# Patient Record
Sex: Female | Born: 1965 | Race: White | Marital: Married | Smoking: Never smoker
Health system: Southern US, Community
[De-identification: ages and names within clinical notes are randomized; demographics above are authoritative.]

## PROBLEM LIST (undated history)

## (undated) DIAGNOSIS — I10 Essential (primary) hypertension: Secondary | ICD-10-CM

## (undated) HISTORY — PX: LASIK: SHX215

## (undated) HISTORY — DX: Essential (primary) hypertension: I10

---

## 1999-11-11 HISTORY — PX: AUGMENTATION MAMMAPLASTY: SUR837

## 2010-11-10 HISTORY — PX: AUGMENTATION MAMMAPLASTY: SUR837

## 2018-01-15 ENCOUNTER — Other Ambulatory Visit: Payer: Self-pay | Admitting: Occupational Medicine

## 2018-01-15 ENCOUNTER — Ambulatory Visit
Admission: RE | Admit: 2018-01-15 | Discharge: 2018-01-15 | Disposition: A | Discharge status: Home / Self Care | Payer: Self-pay | Source: Ambulatory Visit | Attending: Occupational Medicine | Admitting: Occupational Medicine

## 2018-01-15 DIAGNOSIS — Z021 Encounter for pre-employment examination: Secondary | ICD-10-CM

## 2019-01-08 ENCOUNTER — Ambulatory Visit: Payer: Self-pay | Admitting: Nurse Practitioner

## 2019-01-08 VITALS — BP 120/90 | HR 82 | Temp 98.7°F | Resp 12 | Wt 168.0 lb

## 2019-01-08 DIAGNOSIS — H6121 Impacted cerumen, right ear: Secondary | ICD-10-CM

## 2019-01-08 NOTE — Progress Notes (Signed)
MRN: [id number] DOB: 01-15-1966  Subjective:   Lauren Lara is a 53 y.o. female presenting for chief complaint of right air pain (x4 days (benadyrl allergy)) .  Reports 4 day history of right ear pain.Marland Kitchen Has tried apple cider vinegar and peroxide for relief.  Patient has also been taking Benadryl Allergy at bedtime. Denies fever, sinus headache, sinus congestion , itchy watery eyes, red eyes, ear drainage, sore throat, dry cough, productive cough, wheezing and shortness of breath, chills, fatigue, nausea, vomiting, abdominal pain and diarrhea. 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 Admits to a history of seasonal allergies, denies history of asthma. Denies smoking. Denies any other aggravating or relieving factors, no other questions or concerns.  Review of Systems  Constitutional: Negative.   HENT: Positive for ear pain (right). Negative for congestion, ear discharge, sinus pain and sore throat.   Eyes: Negative.   Respiratory: Negative.   Gastrointestinal: Negative.   Skin: Negative.   Neurological: Negative.     Lauren Lara has a current medication list which includes the following prescription(s): phentermine hcl. Also has No Known Allergies.  Lauren Lara  has no past medical history on file. Also  has no past surgical history on file.   Objective:   Vitals: BP 120/90   Pulse 82   Temp 98.7 F (37.1 C)   Resp 12   Wt 168 lb (76.2 kg)   SpO2 98%   Physical Exam Vitals signs reviewed.  Constitutional:      General: She is not in acute distress. HENT:     Head: Normocephalic.     Right Ear: Ear canal and external ear normal. There is impacted cerumen.     Left Ear: Ear canal and external ear normal.     Ears:     Comments: Moderate cerumen impaction to right ear. Could not visualize TM.  Will perform gentle irrigation.  S/P Ear Irrigation: Right TM opaque, landmarks are visible, no bulging or erythema noted.  Canal is without erythema.    Nose:  Mucosal edema present.     Right Turbinates: Enlarged and swollen.     Left Turbinates: Enlarged and swollen.     Right Sinus: No maxillary sinus tenderness or frontal sinus tenderness.     Left Sinus: No maxillary sinus tenderness or frontal sinus tenderness.     Mouth/Throat:     Lips: Pink.     Pharynx: Oropharynx is clear. Uvula midline. No pharyngeal swelling, oropharyngeal exudate, posterior oropharyngeal erythema or uvula swelling.     Tonsils: Swelling: 0 on the right. 0 on the left.  Eyes:     Pupils: Pupils are equal, round, and reactive to light.  Neck:     Musculoskeletal: Normal range of motion and neck supple.  Cardiovascular:     Rate and Rhythm: Normal rate and regular rhythm.     Pulses: Normal pulses.     Heart sounds: Normal heart sounds.  Pulmonary:     Effort: Pulmonary effort is normal.     Breath sounds: Normal breath sounds.  Abdominal:     General: Abdomen is flat. Bowel sounds are normal.     Tenderness: There is no abdominal tenderness.  Lymphadenopathy:     Cervical: No cervical adenopathy.  Skin:    General: Skin is warm and dry.     Capillary Refill: Capillary refill takes less than 2 seconds.  Neurological:     General: No focal deficit present.  Mental Status: She is alert and oriented to person, place, and time.     Cranial Nerves: No cranial nerve deficit.   Patient reports no ear pain after ear irrigation.  Patient tolerated procedure well.  Assessment and Plan :   Exam findings, diagnosis etiology and medication use and indications reviewed with patient. Follow- Up and discharge instructions provided. No emergent/urgent issues found on exam.  After gentle irrigation of the right ear, patient was found to have cerumen impaction without further infection. Patient's canal was without erythema or edema. Patient education was provided. Patient did not exhibit a middle ear infection, or purulent drainage in the middle ear. Discussed with patient  how to use peroxide to perform cerumen disimpaction at home, along with using OTC ear wax softner  Patient verbalized understanding of information provided and agrees with plan of care (POC), all questions answered. The patient is advised to call or return to clinic if condition does not see an improvement in symptoms, or to seek the care of the closest emergency department if condition worsens with the above plan   1. Impacted cerumen of right ear  -Purchase OTC Debrox to help soften the wax in the ears.  Use as directed. -May allow warm water to run into the ear canal occasionally to keep the ear wax soft. -Ibuprofen or Tylenol for discomfort. -Warm compresses as needed to the affected ear until symptoms improve. -Follow up as needed.

## 2019-01-08 NOTE — Patient Instructions (Addendum)
Earwax Buildup, Adult  -Purchase OTC Debrox to help soften the wax in the ears.  Use as directed. -May allow warm water to run into the ear canal occasionally to keep the ear wax soft. -Ibuprofen or Tylenol for discomfort. -Warm compresses as needed to the affected ear until symptoms improve. -Follow up as needed.  e ears produce a substance called earwax that helps keep bacteria out of the ear and protects the skin in the ear canal. Occasionally, earwax can build up in the ear and cause discomfort or hearing loss. What increases the risk? This condition is more likely to develop in people who:  Are female.  Are elderly.  Naturally produce more earwax.  Clean their ears often with cotton swabs.  Use earplugs often.  Use in-ear headphones often.  Wear hearing aids.  Have narrow ear canals.  Have earwax that is overly thick or sticky.  Have eczema.  Are dehydrated.  Have excess hair in the ear canal. What are the signs or symptoms? Symptoms of this condition include:  Reduced or muffled hearing.  A feeling of fullness in the ear or feeling that the ear is plugged.  Fluid coming from the ear.  Ear pain.  Ear itch.  Ringing in the ear.  Coughing.  An obvious piece of earwax that can be seen inside the ear canal. How is this diagnosed? This condition may be diagnosed based on:  Your symptoms.  Your medical history.  An ear exam. During the exam, your health care provider will look into your ear with an instrument called an otoscope. You may have tests, including a hearing test. How is this treated? This condition may be treated by:  Using ear drops to soften the earwax.  Having the earwax removed by a health care provider. The health care provider may: ? Flush the ear with water. ? Use an instrument that has a loop on the end (curette). ? Use a suction device.  Surgery to remove the wax buildup. This may be done in severe cases. Follow these  instructions at home:   Take over-the-counter and prescription medicines only as told by your health care provider.  Do not put any objects, including cotton swabs, into your ear. You can clean the opening of your ear canal with a washcloth or facial tissue.  Follow instructions from your health care provider about cleaning your ears. Do not over-clean your ears.  Drink enough fluid to keep your urine clear or pale yellow. This will help to thin the earwax.  Keep all follow-up visits as told by your health care provider. If earwax builds up in your ears often or if you use hearing aids, consider seeing your health care provider for routine, preventive ear cleanings. Ask your health care provider how often you should schedule your cleanings.  If you have hearing aids, clean them according to instructions from the manufacturer and your health care provider. Contact a health care provider if:  You have ear pain.  You develop a fever.  You have blood, pus, or other fluid coming from your ear.  You have hearing loss.  You have ringing in your ears that does not go away.  Your symptoms do not improve with treatment.  You feel like the room is spinning (vertigo). Summary  Earwax can build up in the ear and cause discomfort or hearing loss.  The most common symptoms of this condition include reduced or muffled hearing and a feeling of fullness in the ear  or feeling that the ear is plugged.  This condition may be diagnosed based on your symptoms, your medical history, and an ear exam.  This condition may be treated by using ear drops to soften the earwax or by having the earwax removed by a health care provider.  Do not put any objects, including cotton swabs, into your ear. You can clean the opening of your ear canal with a washcloth or facial tissue. This information is not intended to replace advice given to you by your health care provider. Make sure you discuss any questions you  have with your health care provider. Document Released: 12/04/2004 Document Revised: 10/08/2017 Document Reviewed: 01/07/2017 Elsevier Interactive Patient Education  2019 [employer].

## 2019-01-10 ENCOUNTER — Telehealth: Payer: Self-pay

## 2019-01-10 NOTE — Telephone Encounter (Signed)
Patient did not answered the phone, I left a message asking to call us back.  

## 2019-03-07 IMAGING — CR DG CHEST 1V
1 series · 1 of 1 positions shown · non-contrast
Comparison: None.

CLINICAL DATA: Pre-employment chest x-ray

EXAM:
CHEST 1 VIEW

[w chest pa]
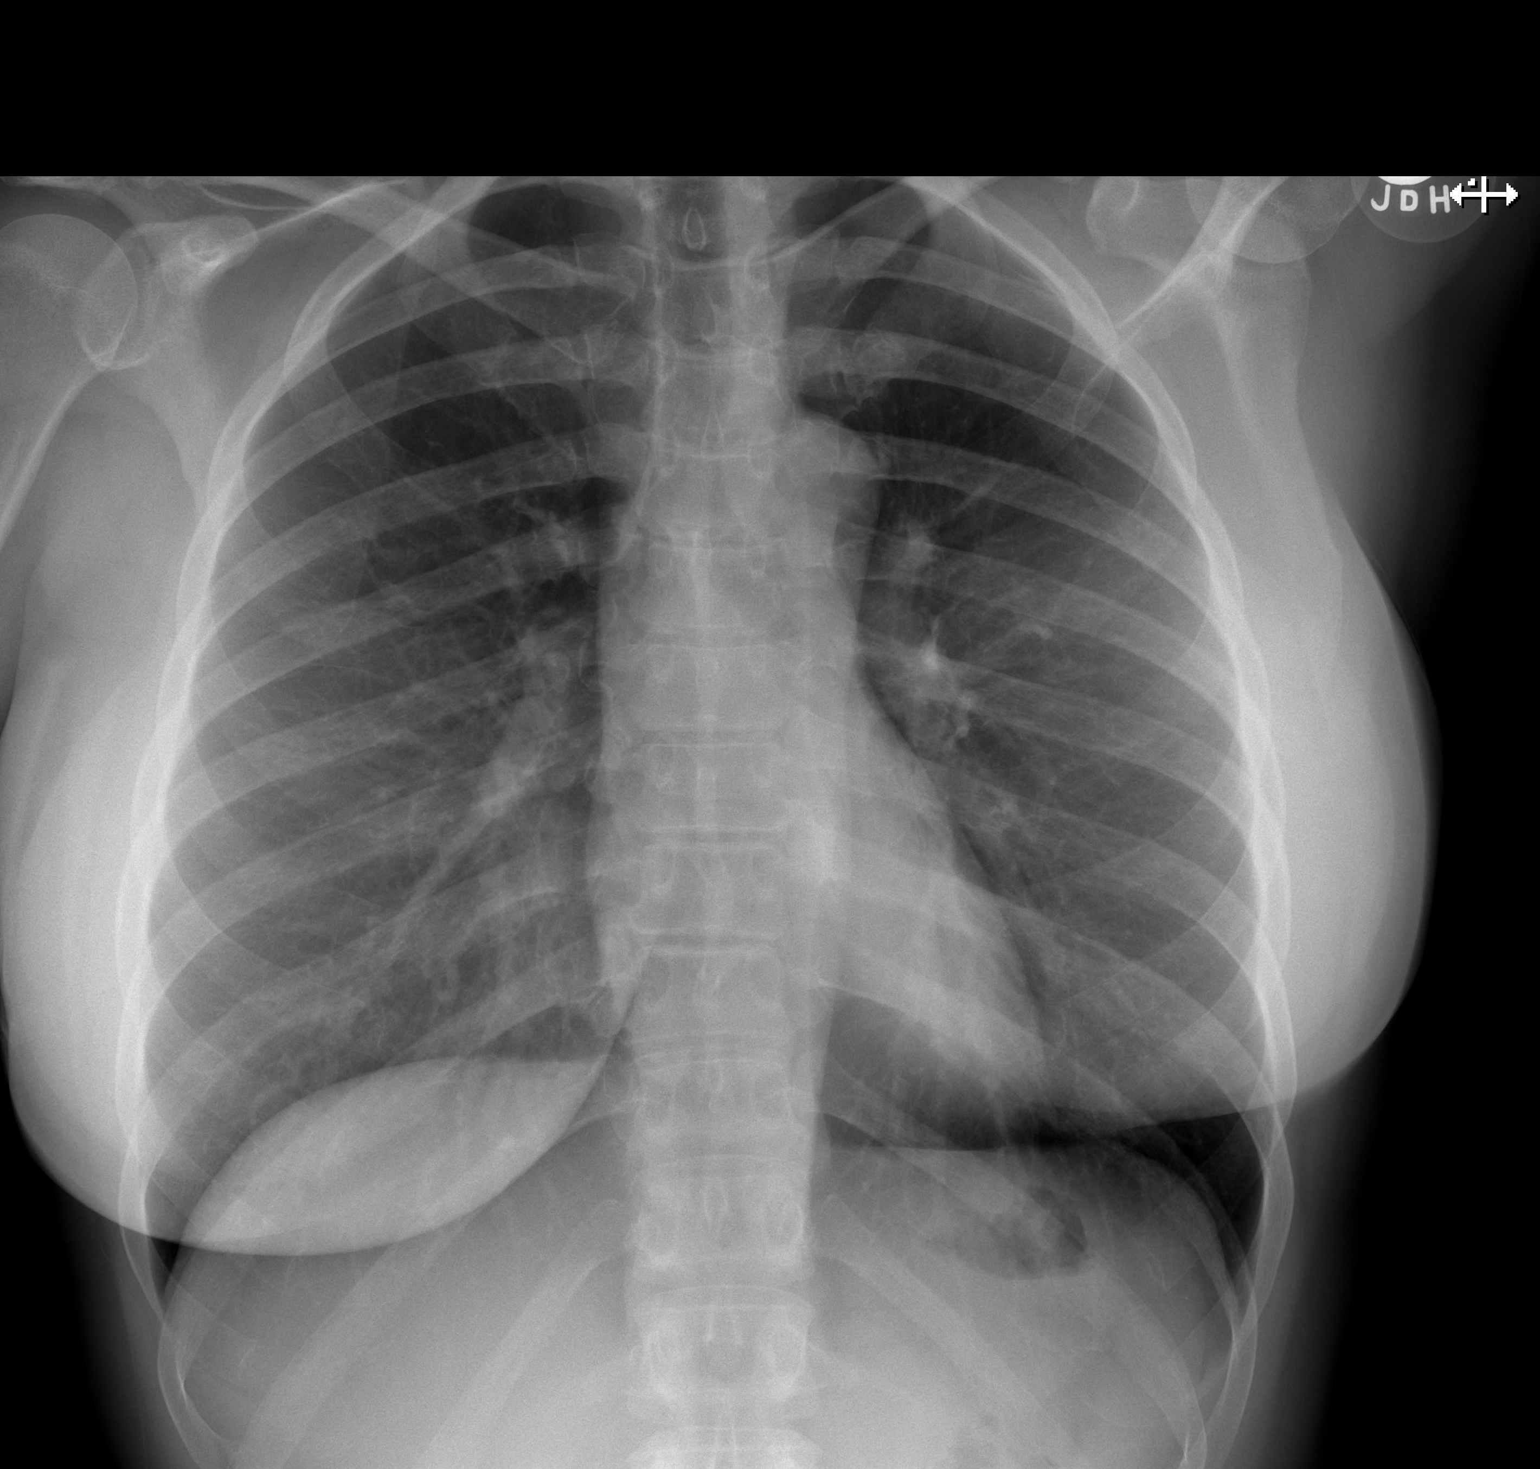

[1 of 1 positions shown; findings below may reference images not displayed]

FINDINGS: The heart size and mediastinal contours are within normal limits.
Both lungs are clear. The visualized skeletal structures are
unremarkable.
IMPRESSION: No active disease.

## 2022-05-16 ENCOUNTER — Ambulatory Visit (INDEPENDENT_AMBULATORY_CARE_PROVIDER_SITE_OTHER): Payer: Self-pay | Admitting: Plastic Surgery

## 2022-05-16 ENCOUNTER — Encounter: Payer: Self-pay | Admitting: Plastic Surgery

## 2022-05-16 DIAGNOSIS — Z719 Counseling, unspecified: Secondary | ICD-10-CM

## 2022-05-16 NOTE — Addendum Note (Signed)
Addended by: Peggye Form on: 05/16/2022 11:56 AM   Modules accepted: Orders

## 2022-05-16 NOTE — Progress Notes (Signed)
   Subjective:    Patient ID: Lauren Lara, female    DOB: 1966-10-08, 56 y.o.   MRN: [id number]  The patient is a 55 year old female here for evaluation of her breasts.  She had implants placed in 2002 and then replaced in and 2012.  Once that were saline and once that were silicone.  Its not clear but I think the current ones are silicone.  She is a double D breast size.  She is 5 feet 8 inches tall and weighs 190 pounds.  Her sternal notch to nipple distance on the right is 28 cm and 29 cm on the left.  She is not a smoker diabetes she is due for a mammogram last one was 14 years ago and was negative but she does have a strong family history of her mother breast cancer.  She is interested in a mastopexy as well.  She states that she is overly large and would like to be around a size B cup.      Review of Systems  Constitutional: Negative.   HENT: Negative.    Eyes: Negative.   Respiratory: Negative.  Negative for chest tightness and shortness of breath.   Cardiovascular: Negative.   Gastrointestinal: Negative.   Endocrine: Negative.   Genitourinary: Negative.   Musculoskeletal:  Positive for back pain and neck pain.  Skin: Negative.        Objective:   Physical Exam Vitals and nursing note reviewed.  Constitutional:      Appearance: Normal appearance.  HENT:     Head: Normocephalic and atraumatic.  Cardiovascular:     Rate and Rhythm: Normal rate.     Pulses: Normal pulses.  Pulmonary:     Effort: Pulmonary effort is normal.  Abdominal:     Palpations: Abdomen is soft.  Musculoskeletal:        General: No swelling or deformity.  Skin:    General: Skin is warm.     Capillary Refill: Capillary refill takes less than 2 seconds.  Neurological:     Mental Status: She is alert and oriented to person, place, and time.  Psychiatric:        Mood and Affect: Mood normal.        Behavior: Behavior normal.        Thought Content: Thought content normal.          Assessment & Plan:     ICD-10-CM   1. Encounter for counseling  Z71.9       Pictures were obtained of the patient and placed in the chart with the patient's or guardian's permission.  Plan for removal of bilateral implants with mastopexy.  We will also plan on removing the capsule as she does not want to have the implants replaced in the future.  She might have drains.

## 2022-05-27 ENCOUNTER — Other Ambulatory Visit: Payer: Self-pay | Admitting: Plastic Surgery

## 2022-05-27 ENCOUNTER — Ambulatory Visit
Admission: RE | Admit: 2022-05-27 | Discharge: 2022-05-27 | Disposition: A | Discharge status: Home / Self Care | Payer: 59 | Source: Ambulatory Visit | Attending: Plastic Surgery | Admitting: Plastic Surgery

## 2022-05-27 DIAGNOSIS — Z719 Counseling, unspecified: Secondary | ICD-10-CM

## 2022-06-03 ENCOUNTER — Telehealth: Payer: Self-pay

## 2022-06-03 NOTE — Telephone Encounter (Signed)
Faxed clinical note, mmg and photos to [facility] for review.

## 2022-06-05 ENCOUNTER — Encounter: Payer: Self-pay | Admitting: *Deleted

## 2022-06-12 NOTE — Telephone Encounter (Signed)
Patient called to follow up on breast reduction surgery. Mammogram received; patient requesting call back with next steps.  Please advise at [phone]

## 2022-06-13 ENCOUNTER — Telehealth: Payer: Self-pay

## 2022-06-13 NOTE — Telephone Encounter (Signed)
Called patient and advised insurance denied for removal of implants. She would like a quote.

## 2022-06-17 ENCOUNTER — Telehealth: Payer: Self-pay | Admitting: *Deleted

## 2022-06-17 NOTE — Telephone Encounter (Signed)
Contacted patient to schedule surgery. She needs to discuss estimate with spouse and will call back when ready to schedule.

## 2023-01-30 ENCOUNTER — Encounter: Payer: Self-pay | Admitting: Cardiology

## 2023-01-30 ENCOUNTER — Ambulatory Visit: Payer: 59 | Admitting: Cardiology

## 2023-01-30 VITALS — BP 135/97 | HR 93 | Ht 68.0 in | Wt 198.0 lb

## 2023-01-30 DIAGNOSIS — R9431 Abnormal electrocardiogram [ECG] [EKG]: Secondary | ICD-10-CM

## 2023-01-30 DIAGNOSIS — I10 Essential (primary) hypertension: Secondary | ICD-10-CM

## 2023-01-30 MED ORDER — AMLODIPINE BESYLATE 5 MG PO TABS: 5.0000 mg | ORAL_TABLET | Freq: Every morning | ORAL | 0 refills | Status: DC

## 2023-01-30 NOTE — Progress Notes (Signed)
ID:  Lauren Lara, DOB Mar 23, 1966, MRN [id number]  PCP:  Patient, No Pcp Per  Cardiologist:  Rex Kras, DO, [facility] (established care 01/30/2023)  REASON FOR CONSULT: Hypertension  REQUESTING PHYSICIAN:  Jaynee Eagles, MD [place],   [id number]  Chief Complaint  Patient presents with   Hypertension   New Patient (Initial Visit)   Dizziness    HPI  Lauren Lara is a 57 y.o. Caucasian female who presents to the clinic for evaluation of hypertension at the request of Jaynee Eagles, MD. Her past medical history and cardiovascular risk factors include: hypertension.  Patient was referred to the practice for evaluation and management for benign essential hypertension.  She has been doing well for the last several years however since February 2024 she has been noticing elevated blood pressure readings.  Followed up with PCP who prescribed clonidine to take on as needed basis she is only taken 3 tablets on separate occasions due to elevated readings.  She has implemented lifestyle changes by reducing/eliminating monster drinks and as well as decreasing salt on a daily basis.  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  She denies anginal chest pain or heart failure symptoms.  Recently started on progesterone tablets on 01/29/2023.  No family history of premature coronary artery disease or sudden cardiac death.  FUNCTIONAL STATUS: Exercises regularly with running, elliptical, resistance training.   ALLERGIES: No Known Allergies  MEDICATION LIST PRIOR TO VISIT: Current Meds  Medication Sig   amLODipine (NORVASC) 5 MG tablet Take 1 tablet (5 mg total) by mouth every morning.   Biotin 1 MG CAPS Take by mouth daily at 6 (six) AM.   cyanocobalamin (VITAMIN B12) 500 MCG tablet Take 500 mcg by mouth daily.   Multiple Vitamin (MULTIVITAMIN) capsule Take 1 capsule by mouth daily.   progesterone (PROMETRIUM) 100 MG  capsule Take 100 mg by mouth daily.   [DISCONTINUED] cloNIDine (CATAPRES) 0.1 MG tablet Take 0.1 mg by mouth daily.   [DISCONTINUED] cyclobenzaprine (FLEXERIL) 10 MG tablet Take 10 mg by mouth at bedtime.   [DISCONTINUED] prednisoLONE acetate (PRED FORTE) 1 % ophthalmic suspension Place 1 drop into both eyes every 2 (two) hours while awake.   [DISCONTINUED] predniSONE (DELTASONE) 5 MG tablet Take 5 mg by mouth daily with breakfast.     PAST MEDICAL HISTORY: Past Medical History:  Diagnosis Date   Hypertension     PAST SURGICAL HISTORY: Past Surgical History:  Procedure Laterality Date   AUGMENTATION MAMMAPLASTY Bilateral 2001   AUGMENTATION MAMMAPLASTY Bilateral 2012   LASIK      FAMILY HISTORY: The patient family history includes Breast cancer (age of onset: 74) in her mother; Hypertension in her father and mother.  SOCIAL HISTORY:  The patient  reports that she has never smoked. She has never used smokeless tobacco. She reports current alcohol use of about 2.0 standard drinks of alcohol per week.  REVIEW OF SYSTEMS: Review of Systems  Cardiovascular:  Negative for chest pain, claudication, dyspnea on exertion, irregular heartbeat, leg swelling, near-syncope, orthopnea, palpitations, paroxysmal nocturnal dyspnea and syncope.  Respiratory:  Positive for snoring. Negative for shortness of breath.   Hematologic/Lymphatic: Negative for bleeding problem.  Musculoskeletal:  Negative for muscle cramps and myalgias.  Neurological:  Positive for dizziness (with change in position (mostly)). Negative for light-headedness.    PHYSICAL EXAM:    01/30/2023   12:06 PM 05/16/2022   11:04 AM 01/08/2019  10:35 AM  Vitals with BMI  Height 5\' 8"  5\' 8"    Weight 198 lbs 190 lbs 168 lbs  BMI [id number]   Systolic [id number]  Diastolic 97 A999333 90  Pulse 93 75 82    Physical Exam  Constitutional: No distress.  Age appropriate, hemodynamically stable.   Neck: No JVD present.   Cardiovascular: Normal rate, regular rhythm, S1 normal, S2 normal, intact distal pulses and normal pulses. Exam reveals no gallop, no S3 and no S4.  No murmur heard. Pulmonary/Chest: Effort normal and breath sounds normal. No stridor. She has no wheezes. She has no rales.  Abdominal: Soft. Bowel sounds are normal. She exhibits no distension. There is no abdominal tenderness.  Musculoskeletal:        General: No edema.     Cervical back: Neck supple.  Neurological: She is alert and oriented to person, place, and time. She has intact cranial nerves (2-12).  Skin: Skin is warm and moist.   CARDIAC DATABASE: EKG: 01/30/2023: Sinus rhythm, 87 bpm, left axis, left anterior fascicular block, LVH per voltage criteria, consider old anteroseptal infarct.  No prior ECGs available for comparison  Echocardiogram: No results found for this or any previous visit from the past 1095 days.    Stress Testing: No results found for this or any previous visit from the past 1095 days.   Heart Catheterization: None  LABORATORY DATA: External Labs: Collected: 01/06/2023 provided by PCP. Hemoglobin 15.1, hematocrit 43.9%. BUN 14, creatinine 0.95. Sodium 142, potassium 4.5, chloride 105, bicarb 23. AST 21, ALT 18, alkaline phosphatase 81. eGFR 71   IMPRESSION:    ICD-10-CM   1. Benign hypertension  I10 EKG 12-Lead    PCV ECHOCARDIOGRAM COMPLETE    amLODipine (NORVASC) 5 MG tablet    2. Nonspecific abnormal electrocardiogram (ECG) (EKG)  R94.31 PCV ECHOCARDIOGRAM COMPLETE       RECOMMENDATIONS: Deshun Lorman is a 57 y.o. Caucasian female whose past medical history and cardiac risk factors include: hypertension.   Benign hypertension Onset February 2024.  Home blood pressures are quite labile with SBP consistently greater than 120 mmHg.  Discontinue clonidine Will start amlodipine 5 mg p.o. every morning.  Medication profile discussed. I suspect that she may have had benign essential  hypertension for a while given the fact that she has LVH per voltage criteria.  Would like to order 2D echo to evaluate for LVEF, diastolic function, and structural heart disease. Agree with complete cessation of energy drinks. Reemphasized importance of reducing salt in the diet. Encouraged physical activity 30 minutes a day 5 days a week as tolerated.  Patient is asked to bring her most recent lipid profile in for review.  EKG does not show concerns for underlying ischemia or injury pattern.  She does not have any anginal chest pain or heart failure symptoms.  She questions the need for stress test.  Would like to hold off at least until her blood pressures are well-controlled and this can be readdressed.  Data Reviewed: I have independently reviewed external notes provided by the referring provider as part of this office visit.   I have independently reviewed results of EKG, external labs, as part of medical decision making. I have ordered the following tests:  Orders Placed This Encounter  Procedures   EKG 12-Lead   PCV ECHOCARDIOGRAM COMPLETE    Standing Status:   Future    Standing Expiration Date:   01/30/2024  I have made medications changes at today's encounter  as noted above.  FINAL MEDICATION LIST END OF ENCOUNTER: Meds ordered this encounter  Medications   amLODipine (NORVASC) 5 MG tablet    Sig: Take 1 tablet (5 mg total) by mouth every morning.    Dispense:  30 tablet    Refill:  0    Medications Discontinued During This Encounter  Medication Reason   cloNIDine (CATAPRES) 0.1 MG tablet Completed Course   cyclobenzaprine (FLEXERIL) 10 MG tablet Completed Course   cycloSPORINE (RESTASIS) 0.05 % ophthalmic emulsion Completed Course   prednisoLONE acetate (PRED FORTE) 1 % ophthalmic suspension Completed Course   predniSONE (DELTASONE) 5 MG tablet Completed Course   Besifloxacin HCl (BESIVANCE) 0.6 % SUSP Completed Course     Current Outpatient Medications:     amLODipine (NORVASC) 5 MG tablet, Take 1 tablet (5 mg total) by mouth every morning., Disp: 30 tablet, Rfl: 0   Biotin 1 MG CAPS, Take by mouth daily at 6 (six) AM., Disp: , Rfl:    cyanocobalamin (VITAMIN B12) 500 MCG tablet, Take 500 mcg by mouth daily., Disp: , Rfl:    Multiple Vitamin (MULTIVITAMIN) capsule, Take 1 capsule by mouth daily., Disp: , Rfl:    progesterone (PROMETRIUM) 100 MG capsule, Take 100 mg by mouth daily., Disp: , Rfl:   Orders Placed This Encounter  Procedures   EKG 12-Lead   PCV ECHOCARDIOGRAM COMPLETE    There are no Patient Instructions on file for this visit.   --Continue cardiac medications as reconciled in final medication list. --Return in about 6 weeks (around 03/13/2023) for Follow up, BP. or sooner if needed. --Continue follow-up with your primary care physician regarding the management of your other chronic comorbid conditions.  Patient's questions and concerns were addressed to her satisfaction. She voices understanding of the instructions provided during this encounter.   This note was created using a voice recognition software as a result there may be grammatical errors inadvertently enclosed that do not reflect the nature of this encounter. Every attempt is made to correct such errors.  Rex Kras, [place], [facility]  Pager:  [phone] Office: [phone]

## 2023-02-25 ENCOUNTER — Ambulatory Visit: Payer: 59

## 2023-02-25 ENCOUNTER — Other Ambulatory Visit: Payer: Self-pay

## 2023-02-25 ENCOUNTER — Telehealth: Payer: Self-pay

## 2023-02-25 DIAGNOSIS — R9431 Abnormal electrocardiogram [ECG] [EKG]: Secondary | ICD-10-CM

## 2023-02-25 DIAGNOSIS — I10 Essential (primary) hypertension: Secondary | ICD-10-CM

## 2023-02-25 MED ORDER — AMLODIPINE BESYLATE 5 MG PO TABS: 5.0000 mg | ORAL_TABLET | Freq: Every morning | ORAL | 0 refills | Status: DC

## 2023-02-25 NOTE — Telephone Encounter (Signed)
Patient says she has stopped drinking monsters and eating sausages and wants to know if she can stop taking BP medications now. I told her to continue taking them, until you advise her otherwise.

## 2023-02-26 ENCOUNTER — Other Ambulatory Visit: Payer: 59

## 2023-02-27 ENCOUNTER — Other Ambulatory Visit: Payer: Self-pay

## 2023-02-27 DIAGNOSIS — I10 Essential (primary) hypertension: Secondary | ICD-10-CM

## 2023-02-27 MED ORDER — AMLODIPINE BESYLATE 5 MG PO TABS: 5.0000 mg | ORAL_TABLET | Freq: Every morning | ORAL | 0 refills | Status: DC

## 2023-02-27 NOTE — Telephone Encounter (Signed)
Yes. Continue taking norvasc. I reviewed her blood pressure log from January 31, 2023 through January 24, 2023  [place], [place], [facility]

## 2023-02-27 NOTE — Telephone Encounter (Signed)
Called patient to inform her about the message above. Patient understood

## 2023-03-04 NOTE — Progress Notes (Signed)
Gave patient results, she acknowledged understanding and had no further questions.

## 2023-03-12 ENCOUNTER — Ambulatory Visit: Payer: 59 | Admitting: Cardiology

## 2023-03-12 ENCOUNTER — Encounter: Payer: Self-pay | Admitting: Cardiology

## 2023-03-12 VITALS — BP 147/96 | HR 75 | Resp 16 | Ht 68.0 in | Wt 195.0 lb

## 2023-03-12 DIAGNOSIS — I10 Essential (primary) hypertension: Secondary | ICD-10-CM

## 2023-03-12 MED ORDER — AMLODIPINE BESYLATE 5 MG PO TABS: 5.0000 mg | ORAL_TABLET | Freq: Every morning | ORAL | 1 refills | Status: DC

## 2023-03-12 NOTE — Progress Notes (Signed)
ID:  Lauren Lara, DOB 10-09-1966, MRN [id number]  PCP:  Patient, No Pcp Per  Cardiologist:  Tessa Lerner, DO, [facility] (established care 01/30/2023)  Date: 03/12/23 Last Office Visit: 01/30/2023  Chief Complaint  Patient presents with   Hypertension   Follow-up    6 weeks    HPI  Lauren Lara is a 57 y.o. Caucasian female whose past medical history and cardiovascular risk factors include: Hormonal placement therapy, hypertension.  Patient was referred to the practice for evaluation and management of benign essential hypertension.  Since February 2024 she has noticed episodes of elevated blood pressure readings.  She was given clonidine to use on as needed basis by PCP and had taken approximately 3 tablets on separate occasions for elevated readings.  Since this finding she is implemented lifestyle changes by reducing/eliminating monster drinks as well as reduce salt in her diet.  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  She was started on progesterone tablets in March 2024.  At the last office visit I started her on amlodipine 5 mg p.o. every morning.  She is tolerated the medication well.  And states that the home blood pressures are around 120 mmHg.  FUNCTIONAL STATUS: Exercises regularly with running, elliptical, resistance training.   ALLERGIES: No Known Allergies  MEDICATION LIST PRIOR TO VISIT: Current Meds  Medication Sig   Biotin 1 MG CAPS Take by mouth daily at 6 (six) AM.   cyanocobalamin (VITAMIN B12) 500 MCG tablet Take 500 mcg by mouth daily.   Multiple Vitamin (MULTIVITAMIN) capsule Take 1 capsule by mouth daily.   progesterone (PROMETRIUM) 100 MG capsule Take 100 mg by mouth daily.   [DISCONTINUED] amLODipine (NORVASC) 5 MG tablet Take 1 tablet (5 mg total) by mouth every morning.     PAST MEDICAL HISTORY: Past Medical History:  Diagnosis Date   Hypertension     PAST SURGICAL HISTORY: Past Surgical History:   Procedure Laterality Date   AUGMENTATION MAMMAPLASTY Bilateral 2001   AUGMENTATION MAMMAPLASTY Bilateral 2012   LASIK      FAMILY HISTORY: The patient family history includes Breast cancer (age of onset: 18) in her mother; Hypertension in her father and mother.  SOCIAL HISTORY:  The patient  reports that she has never smoked. She has never used smokeless tobacco. She reports current alcohol use of about 2.0 standard drinks of alcohol per week. She reports that she does not currently use drugs.  REVIEW OF SYSTEMS: Review of Systems  Cardiovascular:  Negative for chest pain, claudication, dyspnea on exertion, irregular heartbeat, leg swelling, near-syncope, orthopnea, palpitations, paroxysmal nocturnal dyspnea and syncope.  Respiratory:  Negative for shortness of breath.   Hematologic/Lymphatic: Negative for bleeding problem.  Musculoskeletal:  Negative for muscle cramps and myalgias.  Neurological:  Negative for dizziness and light-headedness.   PHYSICAL EXAM:    03/12/2023    3:09 PM 01/30/2023   12:06 PM 05/16/2022   11:04 AM  Vitals with BMI  Height 5\' 8"  5\' 8"  5\' 8"   Weight 195 lbs 198 lbs 190 lbs  BMI 29.66 30.11 28.9  Systolic 147 135 782  Diastolic 96 97 102  Pulse 75 93 75    Physical Exam  Constitutional: No distress.  Age appropriate, hemodynamically stable.   Neck: No JVD present.  Cardiovascular: Normal rate, regular rhythm, S1 normal, S2 normal, intact distal pulses and normal pulses. Exam reveals no gallop, no S3 and no S4.  No murmur heard. Pulmonary/Chest: Effort normal and breath sounds  normal. No stridor. She has no wheezes. She has no rales.  Abdominal: Soft. Bowel sounds are normal. She exhibits no distension. There is no abdominal tenderness.  Musculoskeletal:        General: No edema.     Cervical back: Neck supple.  Neurological: She is alert and oriented to person, place, and time. She has intact cranial nerves (2-12).  Skin: Skin is warm and moist.    CARDIAC DATABASE: EKG: 01/30/2023: Sinus rhythm, 87 bpm, left axis, left anterior fascicular block, LVH per voltage criteria, consider old anteroseptal infarct.  No prior ECGs available for comparison  Echocardiogram: 02/25/2023:  Normal LV systolic function with visual EF 60-65%. Left ventricle cavity is normal in size. Normal left ventricular wall thickness. Normal global wall motion. Normal diastolic filling pattern, normal LAP. Calculated EF 60%. Structurally normal tricuspid valve with trace regurgitation. No evidence of pulmonary hypertension. No prior available for comparison.    Stress Testing: No results found for this or any previous visit from the past 1095 days.   Heart Catheterization: None  LABORATORY DATA: External Labs: Collected: 01/06/2023 provided by PCP. Hemoglobin 15.1, hematocrit 43.9%. BUN 14, creatinine 0.95. Sodium 142, potassium 4.5, chloride 105, bicarb 23. AST 21, ALT 18, alkaline phosphatase 81. eGFR 71   IMPRESSION:    ICD-10-CM   1. Benign hypertension  I10 amLODipine (NORVASC) 5 MG tablet       RECOMMENDATIONS: Lauren Lara is a 57 y.o. Caucasian female whose past medical history and cardiac risk factors include: hypertension.   Benign hypertension Onset February 2024.  Has done well with amlodipine 5 mg p.o. daily.  She is requesting refill. Home blood pressures according to her are 120 mmHg. Office blood pressures are still not well-controlled. She has eliminated the consumption of energy drinks. Echocardiogram notes preserved LVEF, normal LV wall thickness, normal diastolic function, no significant valvular heart disease. Reemphasized importance of reducing salt in the diet. Encouraged physical activity 30 minutes a day 5 days a week as tolerated. If her home blood pressures are consistently greater than 120 mmHg she is asked to call the office for further medication titration.  FINAL MEDICATION LIST END OF ENCOUNTER: Meds  ordered this encounter  Medications   amLODipine (NORVASC) 5 MG tablet    Sig: Take 1 tablet (5 mg total) by mouth every morning.    Dispense:  90 tablet    Refill:  1    Medications Discontinued During This Encounter  Medication Reason   amLODipine (NORVASC) 5 MG tablet Reorder     Current Outpatient Medications:    Biotin 1 MG CAPS, Take by mouth daily at 6 (six) AM., Disp: , Rfl:    cyanocobalamin (VITAMIN B12) 500 MCG tablet, Take 500 mcg by mouth daily., Disp: , Rfl:    Multiple Vitamin (MULTIVITAMIN) capsule, Take 1 capsule by mouth daily., Disp: , Rfl:    progesterone (PROMETRIUM) 100 MG capsule, Take 100 mg by mouth daily., Disp: , Rfl:    amLODipine (NORVASC) 5 MG tablet, Take 1 tablet (5 mg total) by mouth every morning., Disp: 90 tablet, Rfl: 1  No orders of the defined types were placed in this encounter.   There are no Patient Instructions on file for this visit.   --Continue cardiac medications as reconciled in final medication list. --Return in about 6 months (around 09/12/2023) for Follow up, BP. or sooner if needed. --Continue follow-up with your primary care physician regarding the management of your other chronic comorbid conditions.  Patient's  questions and concerns were addressed to her satisfaction. She voices understanding of the instructions provided during this encounter.   This note was created using a voice recognition software as a result there may be grammatical errors inadvertently enclosed that do not reflect the nature of this encounter. Every attempt is made to correct such errors.  Total time spent: 30 minutes.  Tessa Lerner, [place], [facility]  Pager:  [phone] Office: [phone]

## 2023-05-12 ENCOUNTER — Other Ambulatory Visit: Payer: Self-pay | Admitting: Nurse Practitioner

## 2023-05-12 DIAGNOSIS — Z1231 Encounter for screening mammogram for malignant neoplasm of breast: Secondary | ICD-10-CM

## 2023-05-20 ENCOUNTER — Telehealth: Payer: Self-pay | Admitting: Plastic Surgery

## 2023-05-20 NOTE — Telephone Encounter (Signed)
Patient calling in she states she doesn't want to have surgery until Jan 2025. I let her know we will call her Nov or Dec and schedule because they schedule center doesn't have they calendar booked out that far.

## 2023-06-11 ENCOUNTER — Ambulatory Visit
Admission: RE | Admit: 2023-06-11 | Discharge: 2023-06-11 | Disposition: A | Discharge status: Home / Self Care | Payer: 59 | Source: Ambulatory Visit | Attending: Nurse Practitioner | Admitting: Nurse Practitioner

## 2023-06-11 ENCOUNTER — Ambulatory Visit: Payer: 59

## 2023-06-11 DIAGNOSIS — Z1231 Encounter for screening mammogram for malignant neoplasm of breast: Secondary | ICD-10-CM

## 2023-09-14 ENCOUNTER — Ambulatory Visit: Payer: 59 | Admitting: Cardiology

## 2023-09-29 ENCOUNTER — Other Ambulatory Visit: Payer: Self-pay | Admitting: Cardiology

## 2023-09-29 DIAGNOSIS — I10 Essential (primary) hypertension: Secondary | ICD-10-CM

## 2024-02-09 ENCOUNTER — Ambulatory Visit: Admitting: Plastic Surgery

## 2024-02-09 ENCOUNTER — Encounter: Payer: Self-pay | Admitting: Plastic Surgery

## 2024-02-09 DIAGNOSIS — Z719 Counseling, unspecified: Secondary | ICD-10-CM

## 2024-02-09 NOTE — Progress Notes (Signed)
   Subjective:    Patient ID: Lauren Lara, female    DOB: [birth year], 58 y.o.   MRN: [id number]  The patient is a 58 year old female joining me by phone for further discussion about her breasts.  She had implants placed in 2002 and then replaced in 2012.  She thinks her current implants are silicone and she is ADD breast size.  She is 5 feet 8 inches tall and weighs 190 pounds.  The patient is wanting to be smaller and is thinking about what she would like to have done.  She wants to wait on mastopexy and give it about 3 months for everything to shrink and relax.      Review of Systems  Constitutional: Negative.   Eyes: Negative.   Respiratory: Negative.    Cardiovascular: Negative.   Gastrointestinal: Negative.   Endocrine: Negative.        Objective:   Physical Exam      Assessment & Plan:     ICD-10-CM   1. Encounter for counseling  Z71.9       The patient was at home and I was at the office.  We spent 5 minutes in discussion.  The plan will be for removal of bilateral implants and capsules without replacement and without a mastopexy at this time. I connected with  Lauren Lara on 02/09/24 by phone and verified that I am speaking with the correct person using two identifiers.   I discussed the limitations of evaluation and management by telemedicine. The patient expressed understanding and agreed to proceed.

## 2024-02-15 ENCOUNTER — Telehealth: Payer: Self-pay | Admitting: Plastic Surgery

## 2024-02-15 NOTE — Telephone Encounter (Signed)
 Patient is complaining that no one has called her to schedule her for surgery, I explained to her that there is a message that heather put in there stating that she is waiting on a quote from Dr. Algis Downs and that the most recent quote was from 2023

## 2024-02-17 ENCOUNTER — Telehealth: Payer: Self-pay

## 2024-02-17 NOTE — Telephone Encounter (Signed)
 Patient called in regards to surgery she and Dr. Ulice Bold discussed. She received a quote to have BL breast implants removed with mastopexy however, she stated Dr. Algis Downs suggested a second option to wait for 3 months after removal to see how things had changed.   Pt stated that she is reconsidering having the removal with mastopexy to save money instead of paying another facility and anesthesia fee a second time. She asked if she could speak to [place], [place] just to discuss and get clarification. I adv that Alan Ripper is in surgery with Dr. Ladona Ridgel and would send a message to her to request she return your call. Patient conveyed understanding.   Call back # [phone]  Thanks!

## 2024-02-19 NOTE — Telephone Encounter (Signed)
 Good Morning [place],   Thank you for sending. I have not met this patient before and she needs to talk with Dr. Ulice Bold if this is in regards to surgical planning. I will see if we can get a telephone visit scheduled with Dr. Ulice Bold.  Thank you, Alan Ripper

## 2024-02-23 ENCOUNTER — Encounter: Payer: Self-pay | Admitting: Plastic Surgery

## 2024-02-23 ENCOUNTER — Ambulatory Visit (INDEPENDENT_AMBULATORY_CARE_PROVIDER_SITE_OTHER): Payer: Self-pay | Admitting: Student

## 2024-02-23 VITALS — BP 142/95 | HR 88 | Wt 192.0 lb

## 2024-02-23 DIAGNOSIS — Z719 Counseling, unspecified: Secondary | ICD-10-CM

## 2024-02-23 MED ORDER — CEPHALEXIN 500 MG PO CAPS: 500.0000 mg | ORAL_CAPSULE | Freq: Four times a day (QID) | ORAL | 0 refills | Status: AC

## 2024-02-23 MED ORDER — ONDANSETRON HCL 4 MG PO TABS: 4.0000 mg | ORAL_TABLET | Freq: Three times a day (TID) | ORAL | 0 refills | Status: DC | PRN

## 2024-02-23 MED ORDER — OXYCODONE HCL 5 MG PO TABS: 2.5000 mg | ORAL_TABLET | Freq: Four times a day (QID) | ORAL | 0 refills | Status: DC | PRN

## 2024-02-23 NOTE — Progress Notes (Signed)
 Patient ID: Lauren Lara, female    DOB: [birth year], 58 y.o.   MRN: [id number]  Chief Complaint  Patient presents with   Pre-op Exam      ICD-10-CM   1. Encounter for counseling  Z71.9        History of Present Illness: Lauren Lara is a 58 y.o.  female  with a history of breast implants.  She presents for preoperative evaluation for upcoming procedure, removal of breast implants, scheduled for 03/14/2024 with Dr. Ulice Bold.  The patient has not had problems with anesthesia.  Patient denies any personal history of breast cancer.  She reports her mother had breast cancer.  She denies any history of cardiac disease.  She denies taking blood thinners.  Patient reports she is not a smoker.  Patient reports she currently takes progesterone for hormone imbalance.  She states that she does not take estrogen.  Patient denies any history of miscarriages.  She denies any personal or family history of blood clots or clotting diseases.  She denies any recent traumas, surgeries or infections.  She denies any history of stroke or heart attack.  She denies any history of Crohn's disease or ulcerative colitis.  She denies any history of COPD or asthma.  She denies any history of cancer.  She denies any varicosities to her lower extremities.  She denies any recent fevers, chills or changes in her health.  Patient today reports that she would like to do a mastopexy at the same time as her implant removal.  Summary of Previous Visit: Patient was seen for consult by Dr. Ulice Bold on 02/09/2024.  At this visit, it was noted that patient had implants placed in 2002 and then replaced in 2012.  Patient thinks that her current implants are silicone and that she is a DD breast size.  The patient was wanting to be smaller and is thinking about what she would like to have done.  Patient reported she wants to wait on a mastopexy and give it about 3 months for everything to shrink and relax.  Job: Does not work at  this time  PMH Significant for: Breast implants   Past Medical History: Allergies: No Known Allergies  Current Medications:  Current Outpatient Medications:    amLODipine (NORVASC) 5 MG tablet, TAKE ONE TABLET BY MOUTH EVERY MORNING., Disp: 90 tablet, Rfl: 1   Biotin 1 MG CAPS, Take by mouth daily at 6 (six) AM., Disp: , Rfl:    cephALEXin (KEFLEX) 500 MG capsule, Take 1 capsule (500 mg total) by mouth 4 (four) times daily for 3 days., Disp: 12 capsule, Rfl: 0   cyanocobalamin (VITAMIN B12) 500 MCG tablet, Take 500 mcg by mouth daily., Disp: , Rfl:    Multiple Vitamin (MULTIVITAMIN) capsule, Take 1 capsule by mouth daily., Disp: , Rfl:    ondansetron (ZOFRAN) 4 MG tablet, Take 1 tablet (4 mg total) by mouth every 8 (eight) hours as needed for up to 20 doses for nausea or vomiting., Disp: 20 tablet, Rfl: 0   oxyCODONE (ROXICODONE) 5 MG immediate release tablet, Take 0.5 tablets (2.5 mg total) by mouth every 6 (six) hours as needed for up to 20 doses for severe pain (pain score 7-10)., Disp: 10 tablet, Rfl: 0   progesterone (PROMETRIUM) 100 MG capsule, Take 100 mg by mouth daily., Disp: , Rfl:   Past Medical Problems: Past Medical History:  Diagnosis Date   Hypertension     Past Surgical History: Past Surgical History:  Procedure Laterality Date   AUGMENTATION MAMMAPLASTY Bilateral 2001   AUGMENTATION MAMMAPLASTY Bilateral 2012   LASIK      Social History: Social History   Socioeconomic History   Marital status: Married    Spouse name: Not on file   Number of children: 0   Years of education: Not on file   Highest education level: Not on file  Occupational History   Not on file  Tobacco Use   Smoking status: Never   Smokeless tobacco: Never  Vaping Use   Vaping status: Never Used  Substance and Sexual Activity   Alcohol use: Yes    Alcohol/week: 2.0 standard drinks of alcohol    Types: 1 Glasses of wine, 1 Cans of beer per week    Comment: once a week   Drug use:  Not Currently   Sexual activity: Not on file  Other Topics Concern   Not on file  Social History Narrative   Not on file   Social Drivers of Health   Financial Resource Strain: Not on file  Food Insecurity: Not on file  Transportation Needs: Not on file  Physical Activity: Not on file  Stress: Not on file  Social Connections: Unknown (03/11/2022)   Received from [facility], [facility]   Social Network    Social Network: Not on file  Intimate Partner Violence: Unknown (02/13/2022)   Received from [employer], [facility]   HITS    Physically Hurt: Not on file    Insult or Talk Down To: Not on file    Threaten Physical Harm: Not on file    Scream or Curse: Not on file    Family History: Family History  Problem Relation Age of Onset   Hypertension Mother    Breast cancer Mother 87   Hypertension Father     Review of Systems: Denies any recent fevers, chills or changes in her health  Physical Exam: Vital Signs BP (!) 142/95 (BP Location: Right Arm, Patient Position: Sitting, Cuff Size: Normal)   Pulse 88   Wt 192 lb (87.1 kg)   SpO2 99%   BMI 29.19 kg/m   Physical Exam  Constitutional:      General: Not in acute distress.    Appearance: Normal appearance. Not ill-appearing.  HENT:     Head: Normocephalic and atraumatic.  Neck:     Musculoskeletal: Normal range of motion.  Cardiovascular:     Rate and Rhythm: Normal rate Pulmonary:     Effort: Pulmonary effort is normal. No respiratory distress.  Musculoskeletal: Normal range of motion.  Skin:    General: Skin is warm and dry.  There was a dark mole noted to the left breast.    Findings: No erythema or rash.  Neurological:     Mental Status: Alert and oriented to person, place, and time. Mental status is at baseline.  Psychiatric:        Mood and Affect: Mood normal.        Behavior: Behavior normal.    Assessment/Plan:  Dr. Orin Birk was made aware that patient would like to have mastopexy  done at the same time as implant removal and was okay with proceeding with mastopexy.  The patient is scheduled for bilateral breast implant removal and mastopexy with Dr. Orin Birk.  Risks, benefits, and alternatives of procedure discussed, questions answered and consent obtained.    Patient reports that she has had the mole to her left breast for a while.  She states that  it has been darkening though.  She states that she would be interested in having it removed.  Discussed with her that I will check with Dr. Orin Birk if this could be removed at the time of surgery.  Smoking Status: Non-smoker; Counseling Given?  N/A Last Mammogram: 06/11/2023; Results: BI-RADS Category 1 negative  Caprini Score: 4; Risk Factors include: Age, BMI > 25, and length of planned surgery. Recommendation for mechanical prophylaxis. Encourage early ambulation.   Pictures obtained: Updated photos taken today.  Post-op Rx sent to pharmacy: Oxycodone, Zofran, Keflex -patient requested half dose oxycodone   Instructed patient to hold any multivitamins or supplements at least 1 week prior to surgery.  Patient expressed understanding.  Patient was provided with the General Surgical Risk consent document and Pain Medication Agreement prior to their appointment.  They had adequate time to read through the risk consent documents and Pain Medication Agreement. We also discussed them in person together during this preop appointment. All of their questions were answered to their satisfaction.  Recommended calling if they have any further questions.  Risk consent form and Pain Medication Agreement to be scanned into patient's chart.  The consent was obtained with risks and complications reviewed which included bleeding, pain, scar, infection and the risk of anesthesia.  The patients questions were answered to the patients expressed satisfaction.    Electronically signed by: Harden Leyden, PA-C 02/23/2024 2:05 PM

## 2024-03-10 ENCOUNTER — Telehealth: Payer: Self-pay | Admitting: *Deleted

## 2024-03-10 NOTE — Telephone Encounter (Signed)
 Notified patient of new OR time 0830 / 0630 arrival

## 2024-03-14 DIAGNOSIS — Z411 Encounter for cosmetic surgery: Secondary | ICD-10-CM

## 2024-03-22 ENCOUNTER — Encounter: Payer: Self-pay | Admitting: Student

## 2024-03-22 ENCOUNTER — Ambulatory Visit (INDEPENDENT_AMBULATORY_CARE_PROVIDER_SITE_OTHER): Payer: Self-pay | Admitting: Student

## 2024-03-22 VITALS — BP 105/74 | HR 82

## 2024-03-22 DIAGNOSIS — Z719 Counseling, unspecified: Secondary | ICD-10-CM

## 2024-03-22 NOTE — Progress Notes (Signed)
 Patient is a 58 year old female with history of breast implants.  She recently underwent removal of her breast implants and mastopexy with Dr. Orin Birk on 03/14/2024.  She is a little over 1 week postop.  She presents to the clinic today for postoperative follow-up.  Today, patient reports she is doing well.  She states that her pain has been controlled and has not needed any medications for pain for the past few days.  She denies any fevers or chills.  Denies any nausea or vomiting.  Reports that she has been eating and drinking without issue.  She reports that her nipples have been a little bit itchy.  Otherwise denies any issues or concerns.  States that her drain output has been very minimal over the past few days, approximately 5 to 10 cc/day.  Chaperone present on exam.  On exam, patient is sitting upright in no acute distress.  Breasts are soft and symmetric.  There is no overlying erythema.  There is a little bit of ecchymosis noted bilaterally.  There are no obvious fluid collections on exam.  NAC's appear to be healthy.  There are Steri-Strips in place over the NAC's.  These were removed without any difficulty.  Incisions appear to be intact with Steri-Strips.  JP drains are intact and functioning bilaterally.  There is minimal serous drainage in the bulbs.  Drains were removed without any difficulty.  Patient tolerated well.  Discussed with the patient that she should apply dressings over her drain sites daily and/or as needed for saturation.  Discussed with her that in 3 to 4 days, she can start applying Vaseline over the drain sites.  Patient expressed understanding.  Discussed with the patient the importance of wearing compression at all times.  And avoiding strenuous activities.  Patient's breast understanding.  Discussed with the patient that hopefully removing the Steri-Strips might help with some of her itchiness.  Discussed with her to continue to monitor.  Patient expressed  understanding.  Patient to follow-up at her neck scheduled appointment.  Instructed her to call in the meantime she has any questions or concerns about anything.

## 2024-04-01 ENCOUNTER — Other Ambulatory Visit: Payer: Self-pay | Admitting: Cardiology

## 2024-04-01 DIAGNOSIS — I10 Essential (primary) hypertension: Secondary | ICD-10-CM

## 2024-04-05 ENCOUNTER — Encounter: Payer: Self-pay | Admitting: Plastic Surgery

## 2024-04-05 ENCOUNTER — Ambulatory Visit (INDEPENDENT_AMBULATORY_CARE_PROVIDER_SITE_OTHER): Payer: Self-pay | Admitting: Plastic Surgery

## 2024-04-05 VITALS — BP 138/83 | HR 75

## 2024-04-05 DIAGNOSIS — Z719 Counseling, unspecified: Secondary | ICD-10-CM

## 2024-04-05 NOTE — Progress Notes (Signed)
 Is a 58 year old female here for follow-up after undergoing surgery last week.  She had her implants removed and a mastopexy bilaterally.  There is no sign of a hematoma or seroma.  Overall she is doing really well.  She is very pleased with the results.  We took pictures and put them in the chart.  Continue with the sports bra I will plan to see her back in 1 to 2 weeks.  Pictures were obtained of the patient and placed in the chart with the patient's or guardian's permission.

## 2024-04-19 ENCOUNTER — Encounter: Admitting: Student

## 2024-04-21 ENCOUNTER — Ambulatory Visit (INDEPENDENT_AMBULATORY_CARE_PROVIDER_SITE_OTHER): Payer: Self-pay | Admitting: Student

## 2024-04-21 ENCOUNTER — Encounter: Payer: Self-pay | Admitting: Student

## 2024-04-21 VITALS — BP 146/86 | HR 75

## 2024-04-21 DIAGNOSIS — Z719 Counseling, unspecified: Secondary | ICD-10-CM

## 2024-04-21 NOTE — Progress Notes (Signed)
 Patient is a 58 year old female with history of breast implants. She recently underwent removal of her breast implants and mastopexy with Dr. Orin Birk on 03/14/2024.  She is about 5 and half weeks postop.  She presents to the clinic today for postoperative follow-up.  Patient was last seen in the clinic on 04/05/2024.  At this visit, patient was doing well.  She had no sign of hematoma or seroma.  She was pleased with her results.  Today, patient reports she is doing well.  She has no new issues or concerns today.  She denies any fevers, chills or pain.  She reports that she feels lighter after the surgery.  Chaperone present on exam.  On exam, patient is sitting upright in no acute distress.  Breasts are soft and symmetric.  There is no overlying erythema.  No fluid collections on exam.  NAC's are healthy.  There is some mild irritation noted to some of the periphery of the NAC's bilaterally.  Incisions are intact and well-healed.  There were a few Monocryl sutures noted.  These were removed.  Patient tolerated well.  There are no signs of infection on exam.  Recommended that patient apply Vaseline around her NAC's for the next week or so.  Discussed with her that after that, she may transition to scar creams.  We discussed scar creams including Mederma, silicone-based scar creams and silicone tapes.  Patient expressed understanding.  Discussed with patient that as of next week, she no longer has to wear her compression and may transition into a regular bra with no underwire.  Discussed with her that she also may start increasing her activities at that time.  I instructed the patient to call if she has any questions or concerns about anything.  Patient to follow-up as needed.  Pictures were obtained of the patient and placed in the chart with the patient's or guardian's permission.

## 2024-11-12 ENCOUNTER — Other Ambulatory Visit: Payer: Self-pay | Admitting: Cardiology

## 2024-11-12 DIAGNOSIS — I10 Essential (primary) hypertension: Secondary | ICD-10-CM

## 2024-12-12 ENCOUNTER — Other Ambulatory Visit: Payer: Self-pay | Admitting: Cardiology

## 2024-12-12 DIAGNOSIS — I10 Essential (primary) hypertension: Secondary | ICD-10-CM
